# Patient Record
Sex: Female | Born: 1965
Health system: Southern US, Community
[De-identification: ages and names within clinical notes are randomized; demographics above are authoritative.]

## PROBLEM LIST (undated history)

## (undated) DIAGNOSIS — Z789 Other specified health status: Secondary | ICD-10-CM

## (undated) HISTORY — PX: KNEE ARTHROSCOPY: SUR90

## (undated) HISTORY — PX: APPENDECTOMY: SHX54

---

## 2005-11-25 ENCOUNTER — Encounter: Admission: RE | Admit: 2005-11-25 | Discharge: 2005-11-25 | Payer: Self-pay | Admitting: Obstetrics and Gynecology

## 2013-05-01 ENCOUNTER — Other Ambulatory Visit: Payer: Self-pay | Admitting: Obstetrics and Gynecology

## 2013-05-01 DIAGNOSIS — R928 Other abnormal and inconclusive findings on diagnostic imaging of breast: Secondary | ICD-10-CM

## 2013-05-04 ENCOUNTER — Ambulatory Visit
Admission: RE | Admit: 2013-05-04 | Discharge: 2013-05-04 | Disposition: A | Discharge status: Home / Self Care | Payer: 59 | Source: Ambulatory Visit | Attending: Obstetrics and Gynecology | Admitting: Obstetrics and Gynecology

## 2013-05-04 DIAGNOSIS — R928 Other abnormal and inconclusive findings on diagnostic imaging of breast: Secondary | ICD-10-CM

## 2014-06-05 ENCOUNTER — Encounter (HOSPITAL_COMMUNITY): Payer: Self-pay | Admitting: *Deleted

## 2014-06-05 ENCOUNTER — Encounter (HOSPITAL_COMMUNITY): Payer: Self-pay | Admitting: Pharmacist

## 2014-06-13 ENCOUNTER — Other Ambulatory Visit: Payer: Self-pay | Admitting: Obstetrics and Gynecology

## 2014-06-15 ENCOUNTER — Encounter (HOSPITAL_COMMUNITY): Payer: 59 | Admitting: Anesthesiology

## 2014-06-15 ENCOUNTER — Ambulatory Visit (HOSPITAL_COMMUNITY): Payer: 59 | Admitting: Anesthesiology

## 2014-06-15 ENCOUNTER — Ambulatory Visit (HOSPITAL_COMMUNITY)
Admission: RE | Admit: 2014-06-15 | Discharge: 2014-06-15 | Disposition: A | Discharge status: Home / Self Care | Payer: 59 | Source: Ambulatory Visit | Attending: Obstetrics and Gynecology | Admitting: Obstetrics and Gynecology

## 2014-06-15 ENCOUNTER — Encounter (HOSPITAL_COMMUNITY): Payer: Self-pay | Admitting: Anesthesiology

## 2014-06-15 ENCOUNTER — Encounter (HOSPITAL_COMMUNITY)
Admission: RE | Disposition: A | Discharge status: Home / Self Care | Payer: Self-pay | Source: Ambulatory Visit | Attending: Obstetrics and Gynecology

## 2014-06-15 DIAGNOSIS — F329 Major depressive disorder, single episode, unspecified: Secondary | ICD-10-CM | POA: Diagnosis not present

## 2014-06-15 DIAGNOSIS — N920 Excessive and frequent menstruation with regular cycle: Secondary | ICD-10-CM | POA: Diagnosis not present

## 2014-06-15 DIAGNOSIS — F419 Anxiety disorder, unspecified: Secondary | ICD-10-CM | POA: Insufficient documentation

## 2014-06-15 DIAGNOSIS — E780 Pure hypercholesterolemia: Secondary | ICD-10-CM | POA: Insufficient documentation

## 2014-06-15 DIAGNOSIS — N840 Polyp of corpus uteri: Secondary | ICD-10-CM | POA: Insufficient documentation

## 2014-06-15 HISTORY — DX: Other specified health status: Z78.9

## 2014-06-15 HISTORY — PX: POLYPECTOMY: SHX5525

## 2014-06-15 HISTORY — PX: DILITATION & CURRETTAGE/HYSTROSCOPY WITH NOVASURE ABLATION: SHX5568

## 2014-06-15 LAB — PREGNANCY, URINE: PREG TEST UR: NEGATIVE

## 2014-06-15 SURGERY — DILATATION & CURETTAGE/HYSTEROSCOPY WITH NOVASURE ABLATION
Anesthesia: General | Site: Vagina

## 2014-06-15 MED ORDER — DEXAMETHASONE SODIUM PHOSPHATE 10 MG/ML IJ SOLN
INTRAMUSCULAR | Status: DC | PRN
  Administered 2014-06-15: 4 mg via INTRAVENOUS

## 2014-06-15 MED ORDER — MIDAZOLAM HCL 2 MG/2ML IJ SOLN
INTRAMUSCULAR | Status: AC
  Filled 2014-06-15: qty 2

## 2014-06-15 MED ORDER — MIDAZOLAM HCL 2 MG/2ML IJ SOLN
INTRAMUSCULAR | Status: DC | PRN
  Administered 2014-06-15: 2 mg via INTRAVENOUS

## 2014-06-15 MED ORDER — KETOROLAC TROMETHAMINE 30 MG/ML IJ SOLN: 15.0000 mg | Freq: Once | INTRAMUSCULAR | Status: DC | PRN

## 2014-06-15 MED ORDER — MEPERIDINE HCL 25 MG/ML IJ SOLN: 6.2500 mg | INTRAMUSCULAR | Status: DC | PRN

## 2014-06-15 MED ORDER — KETOROLAC TROMETHAMINE 30 MG/ML IJ SOLN
INTRAMUSCULAR | Status: AC
  Filled 2014-06-15: qty 1

## 2014-06-15 MED ORDER — ONDANSETRON HCL 4 MG/2ML IJ SOLN
INTRAMUSCULAR | Status: AC
  Filled 2014-06-15: qty 2

## 2014-06-15 MED ORDER — PROPOFOL 10 MG/ML IV BOLUS
INTRAVENOUS | Status: DC | PRN
  Administered 2014-06-15: 150 mg via INTRAVENOUS

## 2014-06-15 MED ORDER — ONDANSETRON HCL 4 MG/2ML IJ SOLN
INTRAMUSCULAR | Status: DC | PRN
  Administered 2014-06-15: 4 mg via INTRAVENOUS

## 2014-06-15 MED ORDER — FENTANYL CITRATE 0.05 MG/ML IJ SOLN
INTRAMUSCULAR | Status: AC
  Filled 2014-06-15: qty 2

## 2014-06-15 MED ORDER — ONDANSETRON HCL 4 MG/2ML IJ SOLN: 4.0000 mg | Freq: Once | INTRAMUSCULAR | Status: DC | PRN

## 2014-06-15 MED ORDER — LIDOCAINE HCL 1 % IJ SOLN
INTRAMUSCULAR | Status: DC | PRN
  Administered 2014-06-15: 10 mL

## 2014-06-15 MED ORDER — LACTATED RINGERS IV SOLN
INTRAVENOUS | Status: DC
Start: 2014-06-15 — End: 2014-06-15
  Administered 2014-06-15 (×2): via INTRAVENOUS

## 2014-06-15 MED ORDER — SCOPOLAMINE 1 MG/3DAYS TD PT72
MEDICATED_PATCH | TRANSDERMAL | Status: AC
  Filled 2014-06-15: qty 1

## 2014-06-15 MED ORDER — KETOROLAC TROMETHAMINE 30 MG/ML IJ SOLN
INTRAMUSCULAR | Status: DC | PRN
  Administered 2014-06-15: 30 mg via INTRAVENOUS

## 2014-06-15 MED ORDER — SCOPOLAMINE 1 MG/3DAYS TD PT72
1.0000 | MEDICATED_PATCH | Freq: Once | TRANSDERMAL | Status: DC
  Administered 2014-06-15: 1.5 mg via TRANSDERMAL

## 2014-06-15 MED ORDER — OXYCODONE-ACETAMINOPHEN 5-325 MG PO TABS: 1.0000 | ORAL_TABLET | ORAL | Status: DC | PRN

## 2014-06-15 MED ORDER — LIDOCAINE HCL (CARDIAC) 20 MG/ML IV SOLN
INTRAVENOUS | Status: AC
  Filled 2014-06-15: qty 5

## 2014-06-15 MED ORDER — FENTANYL CITRATE 0.05 MG/ML IJ SOLN: 25.0000 ug | INTRAMUSCULAR | Status: DC | PRN

## 2014-06-15 MED ORDER — SODIUM CHLORIDE 0.9 % IR SOLN
Status: DC | PRN
  Administered 2014-06-15: 3000 mL

## 2014-06-15 MED ORDER — OXYCODONE-ACETAMINOPHEN 5-325 MG PO TABS: 1.0000 | ORAL_TABLET | ORAL | Status: AC | PRN

## 2014-06-15 MED ORDER — FENTANYL CITRATE 0.05 MG/ML IJ SOLN
INTRAMUSCULAR | Status: DC | PRN
  Administered 2014-06-15 (×2): 50 ug via INTRAVENOUS

## 2014-06-15 MED ORDER — PROPOFOL 10 MG/ML IV EMUL
INTRAVENOUS | Status: AC
  Filled 2014-06-15: qty 20

## 2014-06-15 MED ORDER — LIDOCAINE HCL 1 % IJ SOLN
INTRAMUSCULAR | Status: AC
  Filled 2014-06-15: qty 20

## 2014-06-15 MED ORDER — DEXAMETHASONE SODIUM PHOSPHATE 4 MG/ML IJ SOLN
INTRAMUSCULAR | Status: AC
  Filled 2014-06-15: qty 1

## 2014-06-15 MED ORDER — LIDOCAINE HCL (CARDIAC) 20 MG/ML IV SOLN
INTRAVENOUS | Status: DC | PRN
Start: 2014-06-15 — End: 2014-06-15
  Administered 2014-06-15: 50 mg via INTRAVENOUS

## 2014-06-15 MED ORDER — LACTATED RINGERS IV SOLN
INTRAVENOUS | Status: DC
Start: 2014-06-15 — End: 2014-06-15

## 2014-06-15 SURGICAL SUPPLY — 16 items
ABLATOR ENDOMETRIAL BIPOLAR (ABLATOR) IMPLANT
CANISTER SUCT 3000ML (MISCELLANEOUS) ×3 IMPLANT
CATH ROBINSON RED A/P 16FR (CATHETERS) ×3 IMPLANT
CLOTH BEACON ORANGE TIMEOUT ST (SAFETY) ×3 IMPLANT
CONTAINER PREFILL 10% NBF 60ML (FORM) ×6 IMPLANT
DRAPE HYSTEROSCOPY (DRAPE) ×3 IMPLANT
GLOVE BIOGEL PI IND STRL 6.5 (GLOVE) ×2 IMPLANT
GLOVE BIOGEL PI INDICATOR 6.5 (GLOVE) ×4
GLOVE ECLIPSE 6.5 STRL STRAW (GLOVE) ×3 IMPLANT
GOWN STRL REUS W/TWL LRG LVL3 (GOWN DISPOSABLE) ×6 IMPLANT
PACK VAGINAL MINOR WOMEN LF (CUSTOM PROCEDURE TRAY) ×3 IMPLANT
PAD OB MATERNITY 4.3X12.25 (PERSONAL CARE ITEMS) ×3 IMPLANT
SET TUBING HYSTEROSCOPY 2 NDL (TUBING) ×2 IMPLANT
TOWEL OR 17X24 6PK STRL BLUE (TOWEL DISPOSABLE) ×6 IMPLANT
TUBE HYSTEROSCOPY W Y-CONNECT (TUBING) ×2 IMPLANT
WATER STERILE IRR 1000ML POUR (IV SOLUTION) ×3 IMPLANT

## 2014-06-15 NOTE — Brief Op Note (Signed)
06/15/2014  1:21 PM  PATIENT:  Tamara Glenn  48 y.o. female  PRE-OPERATIVE DIAGNOSIS:  MENORRHAGIA  POST-OPERATIVE DIAGNOSIS:  endometrial polyp  PROCEDURE:  Procedure(s): DILATATION & CURETTAGE/HYSTEROSCOPY  (N/A) POLYPECTOMY (N/A)  SURGEON:  Surgeon(s) and Role:    Philip Aspen* Oceanna Arruda, DO - Primary  ANESTHESIA:   IV sedation  EBL:  Total I/O In: 1200 [I.V.:1200] Out: 60 [Urine:50; Blood:10]  BLOOD ADMINISTERED:none  LOCAL MEDICATIONS USED:  LIDOCAINE   SPECIMEN:  Source of Specimen:  EMC  and polypoid tissue  DISPOSITION OF SPECIMEN:  PATHOLOGY  COUNTS:  yes  PLAN OF CARE: Discharge to home after PACU  PATIENT DISPOSITION:  PACU - hemodynamically stable.   Delay start of Pharmacological VTE agent (>24hrs) due to surgical blood loss or risk of bleeding: not applicable

## 2014-06-15 NOTE — H&P (Signed)
48 y.o. complains of worsening abnormal uterine bleeding frm 2014-2015.  She has a known history of fibroids - 4 small once ranging 1.3-3.7cm.  Recent US showed 2.3cm echogenic focus in endometrium- possible polyp.  Past Medical History  Diagnosis Date  . Medical history non-contributory   MPDD, anxiety, depression, high cholesterol Past Surgical History  Procedure Laterality Date  . Appendectomy    . Knee arthroscopy      History   Social History  . Marital Status: Married    Spouse Name: N/A    Number of Children: N/A  . Years of Education: N/A   Occupational History  . Not on file.   Social History Main Topics  . Smoking status: Never Smoker   . Smokeless tobacco: Not on file  . Alcohol Use: Yes     Comment: occ.  . Drug Use: No  . Sexual Activity: Not on file   Other Topics Concern  . Not on file   Social History Narrative  . No narrative on file    No current facility-administered medications on file prior to encounter.   No current outpatient prescriptions on file prior to encounter.    No Known Allergies  @VITALS2 @  Lungs: clear to ascultation Cor:  RRR Abdomen:  soft, nontender, nondistended. Ex:  no cords, erythema Pelvic:  Def to OR  A:  Perimenopausal abnormal uterine bleeding with US findings c/w possible polyp   P:  D&C hysteroscopy, polypectomy.   All risks, benefits and alternatives d/w patient and she desires to proceed. UPT and other routine pre-op care.    Philip AspenALLAHAN, Mattalyn Anderegg

## 2014-06-15 NOTE — Discharge Instructions (Signed)
DISCHARGE INSTRUCTIONS: D&C  The following instructions have been prepared to help you care for yourself upon your return home.  MAY TAKE IBUPROFEN (MOTRIN, ADVIL) OR ALEVE AFTER 1:00 PM FOR CRAMPS!!   Personal hygiene:  Use sanitary pads for vaginal drainage, not tampons.  Shower the day after your procedure.  NO tub baths, pools or Jacuzzis for 2-3 weeks.  Wipe front to back after using the bathroom.  Activity and limitations:  Do NOT drive or operate any equipment for 24 hours. The effects of anesthesia are still present and drowsiness may result.  Do NOT rest in bed all day.  Walking is encouraged.  Walk up and down stairs slowly.  You may resume your normal activity in one to two days or as indicated by your physician.  Sexual activity: NO intercourse for at least 2 weeks after the procedure, or as indicated by your physician.  Diet: Eat a light meal as desired this evening. You may resume your usual diet tomorrow.  Return to work: You may resume your work activities in one to two days or as indicated by your doctor.  What to expect after your surgery: Expect to have vaginal bleeding/discharge for 2-3 days and spotting for up to 10 days. It is not unusual to have soreness for up to 1-2 weeks. You may have a slight burning sensation when you urinate for the first day. Mild cramps may continue for a couple of days. You may have a regular period in 2-6 weeks.  Call your doctor for any of the following:  Excessive vaginal bleeding, saturating and changing one pad every hour.  Inability to urinate 6 hours after discharge from hospital.  Pain not relieved by pain medication.  Fever of 100.4 F or greater.  Unusual vaginal discharge or odor.   Call for an appointment:    Patients signature: ______________________  Nurses signature ________________________  Support person's signature_______________________

## 2014-06-15 NOTE — Transfer of Care (Signed)
Immediate Anesthesia Transfer of Care Note  Patient: Tamara Glenn  Procedure(s) Performed: Procedure(s): DILATATION & CURETTAGE/HYSTEROSCOPY  (N/A) POLYPECTOMY (N/A)  Patient Location: PACU  Anesthesia Type:General  Level of Consciousness: awake, alert  and oriented  Airway & Oxygen Therapy: Patient Spontanous Breathing and Patient connected to nasal cannula oxygen  Post-op Assessment: Report given to PACU RN and Post -op Vital signs reviewed and stable  Post vital signs: Reviewed and stable  Complications: No apparent anesthesia complications

## 2014-06-15 NOTE — Anesthesia Postprocedure Evaluation (Signed)
  Anesthesia Post-op Note  Patient: Tamara Glenn  Procedure(s) Performed: Procedure(s): DILATATION & CURETTAGE/HYSTEROSCOPY  (N/A) POLYPECTOMY (N/A)  Patient Location: PACU  Anesthesia Type:General  Level of Consciousness: awake, alert  and oriented  Airway and Oxygen Therapy: Patient Spontanous Breathing  Post-op Pain: none  Post-op Assessment: Post-op Vital signs reviewed, Patient's Cardiovascular Status Stable, Respiratory Function Stable, Patent Airway, No signs of Nausea or vomiting and Pain level controlled  Post-op Vital Signs: Reviewed and stable  Last Vitals:  Filed Vitals:   06/15/14 1400  BP: 137/79  Pulse: 77  Temp: 36.6 C  Resp: 13    Complications: No apparent anesthesia complications

## 2014-06-15 NOTE — Anesthesia Preprocedure Evaluation (Signed)
Anesthesia Evaluation  Patient identified by MRN, date of birth, ID band Patient awake    Reviewed: Allergy & Precautions, H&P , NPO status , Patient's Chart, lab work & pertinent test results  Airway Mallampati: I TM Distance: >3 FB Neck ROM: full    Dental no notable dental hx. (+) Teeth Intact   Pulmonary neg pulmonary ROS,    Pulmonary exam normal       Cardiovascular negative cardio ROS      Neuro/Psych negative neurological ROS     GI/Hepatic negative GI ROS, Neg liver ROS,   Endo/Other  negative endocrine ROS  Renal/GU negative Renal ROS     Musculoskeletal   Abdominal Normal abdominal exam  (+)   Peds  Hematology negative hematology ROS (+)   Anesthesia Other Findings   Reproductive/Obstetrics negative OB ROS                           Anesthesia Physical Anesthesia Plan  ASA: II  Anesthesia Plan: General   Post-op Pain Management:    Induction: Intravenous  Airway Management Planned: LMA  Additional Equipment:   Intra-op Plan:   Post-operative Plan:   Informed Consent: I have reviewed the patients History and Physical, chart, labs and discussed the procedure including the risks, benefits and alternatives for the proposed anesthesia with the patient or authorized representative who has indicated his/her understanding and acceptance.     Plan Discussed with: CRNA and Surgeon  Anesthesia Plan Comments:         Anesthesia Quick Evaluation  

## 2014-06-16 NOTE — Op Note (Signed)
[name], [name]             ACCOUNT NO.:  [id number]  MEDICAL RECORD NO.:  [id number]  LOCATION:  [facility]                          FACILITY:  [facility]  PHYSICIAN:  Philip AspenSidney Rucker Pridgeon, DO    DATE OF BIRTH:  October 17, 1965  DATE OF PROCEDURE:  06/15/2014 DATE OF DISCHARGE:  06/15/2014                              OPERATIVE REPORT   PREOPERATIVE DIAGNOSES:  Menorrhagia, ultrasound findings of thickened endometrial stripe consistent with polyp.  POSTOPERATIVE DIAGNOSIS:  Menorrhagia, ultrasound findings of thickened endometrial stripe consistent with polyp, endometrial polyp visualized.  PROCEDURE:  Dilation and curettage, hysteroscopy with polypectomy.  SURGEON:  Philip AspenSidney Jentry Warnell, DO.  ANESTHESIA:  IV sedation.  IV FLUIDS:  1200 mL.  URINE OUTPUT:  50 mL.  ESTIMATED BLOOD LOSS:  10 mL.  LOCAL MEDICATIONS:  10 mL of 1% lidocaine paracervical block.  SPECIMENS:  Endometrial curettage specimen with polypoid tissue. Disposition of specimens to pathology.  FINDINGS:  Uterus sounded to approximately 7 cm.  Anteverted uterus with a large polypoid mass visualized with hysteroscopy.  COMPLICATIONS:  None.  CONDITION:  Stable to PACU.  DESCRIPTION OF PROCEDURE:  The patient was taken to the operating room where anesthesia was administered and found to be adequate.  She was prepped and draped in normal sterile fashion in dorsal lithotomy position.  Bladder was emptied with straight catheter.  Speculum was placed in the vagina.  Cervix was visualized and grasped with a single- tooth tenaculum at the anterior lip.  The uterus was sounded and found to be approximately 7 cm.  The cervix was dilated serially to approximately 25 Pratt and the hysteroscope was entered through the cervix with findings noted above.  The hysteroscope was removed and polyp forceps were introduced with removal of polypoid tissue after several passes.  Curettage was performed in all 4 quadrants and hysteroscope was  then reintroduced.  Polyp was still visualized and a second pass with polyp forceps was performed with additional finding polypoid tissue removed.  The hysteroscope was reinserted and endometrial cavity was found to be empty of further polyps and no other abnormalities noted.  All instruments were removed from the uterus and vagina.  Single-tooth tenaculum sites were found to be hemostatic.  The patient tolerated the procedure well.  Sponge, lap, and needle counts were correct x2.  The patient was taken to recovery in stable condition.          ______________________________ Philip AspenSidney Alfa Leibensperger, DO     [place]/MEDQ  D:  06/16/2014  T:  06/16/2014  Job:  [id number]

## 2014-06-18 ENCOUNTER — Encounter (HOSPITAL_COMMUNITY): Payer: Self-pay | Admitting: Obstetrics and Gynecology

## 2014-10-19 ENCOUNTER — Other Ambulatory Visit: Payer: Self-pay | Admitting: Obstetrics and Gynecology

## 2017-01-20 ENCOUNTER — Other Ambulatory Visit: Payer: Self-pay | Admitting: Obstetrics and Gynecology

## 2017-01-22 ENCOUNTER — Other Ambulatory Visit: Payer: Self-pay | Admitting: Obstetrics and Gynecology

## 2017-01-22 DIAGNOSIS — R928 Other abnormal and inconclusive findings on diagnostic imaging of breast: Secondary | ICD-10-CM

## 2017-01-22 LAB — CYTOLOGY - PAP

## 2017-01-27 ENCOUNTER — Ambulatory Visit
Admission: RE | Admit: 2017-01-27 | Discharge: 2017-01-27 | Disposition: A | Discharge status: Home / Self Care | Payer: Self-pay | Source: Ambulatory Visit | Attending: Obstetrics and Gynecology | Admitting: Obstetrics and Gynecology

## 2017-01-27 DIAGNOSIS — R928 Other abnormal and inconclusive findings on diagnostic imaging of breast: Secondary | ICD-10-CM

## 2019-02-22 ENCOUNTER — Encounter (HOSPITAL_BASED_OUTPATIENT_CLINIC_OR_DEPARTMENT_OTHER): Payer: Self-pay | Admitting: *Deleted

## 2019-02-22 ENCOUNTER — Emergency Department (HOSPITAL_BASED_OUTPATIENT_CLINIC_OR_DEPARTMENT_OTHER)
Admission: EM | Admit: 2019-02-22 | Discharge: 2019-02-22 | Disposition: A | Discharge status: Home / Self Care | Payer: 59 | Attending: Emergency Medicine | Admitting: Emergency Medicine

## 2019-02-22 ENCOUNTER — Other Ambulatory Visit: Payer: Self-pay

## 2019-02-22 ENCOUNTER — Emergency Department (HOSPITAL_BASED_OUTPATIENT_CLINIC_OR_DEPARTMENT_OTHER): Payer: 59

## 2019-02-22 DIAGNOSIS — R55 Syncope and collapse: Secondary | ICD-10-CM | POA: Diagnosis not present

## 2019-02-22 DIAGNOSIS — R5383 Other fatigue: Secondary | ICD-10-CM

## 2019-02-22 LAB — BASIC METABOLIC PANEL
Anion gap: 11 (ref 5–15)
BUN: 21 mg/dL — ABNORMAL HIGH (ref 6–20)
CO2: 22 mmol/L (ref 22–32)
Calcium: 9.9 mg/dL (ref 8.9–10.3)
Chloride: 104 mmol/L (ref 98–111)
Creatinine, Ser: 0.98 mg/dL (ref 0.44–1.00)
GFR calc Af Amer: >60 mL/min (ref >60)
GFR calc non Af Amer: >60 mL/min (ref >60)
Glucose, Bld: 117 mg/dL — ABNORMAL HIGH (ref 70–99)
Potassium: 4 mmol/L (ref 3.5–5.1)
Sodium: 137 mmol/L (ref 135–145)

## 2019-02-22 LAB — CBC WITH DIFFERENTIAL/PLATELET
Abs Immature Granulocytes: 0.05 10*3/uL (ref 0.00–0.07)
Basophils Absolute: 0 10*3/uL (ref 0.0–0.1)
Basophils Relative: 0 %
Eosinophils Absolute: 0.1 10*3/uL (ref 0.0–0.5)
Eosinophils Relative: 1 %
HCT: 44.9 % (ref 36.0–46.0)
Hemoglobin: 15.4 g/dL — ABNORMAL HIGH (ref 12.0–15.0)
Immature Granulocytes: 1 %
Lymphocytes Relative: 10 %
Lymphs Abs: 1.1 10*3/uL (ref 0.7–4.0)
MCH: 30.7 pg (ref 26.0–34.0)
MCHC: 34.3 g/dL (ref 30.0–36.0)
MCV: 89.4 fL (ref 80.0–100.0)
Monocytes Absolute: 0.4 10*3/uL (ref 0.1–1.0)
Monocytes Relative: 4 %
Neutro Abs: 9 10*3/uL — ABNORMAL HIGH (ref 1.7–7.7)
Neutrophils Relative %: 84 %
Platelets: 321 10*3/uL (ref 150–400)
RBC: 5.02 MIL/uL (ref 3.87–5.11)
RDW: 12.8 % (ref 11.5–15.5)
WBC: 10.6 10*3/uL — ABNORMAL HIGH (ref 4.0–10.5)
nRBC: 0 % (ref 0.0–0.2)

## 2019-02-22 LAB — TROPONIN I (HIGH SENSITIVITY): Troponin I (High Sensitivity): 3 ng/L (ref <18)

## 2019-02-22 MED ORDER — SODIUM CHLORIDE 0.9 % IV BOLUS
1000.0000 mL | Freq: Once | INTRAVENOUS | Status: AC
  Administered 2019-02-22: 1000 mL via INTRAVENOUS

## 2019-02-22 NOTE — ED Notes (Signed)
Pt lying flat for orthostatic VS 

## 2019-02-22 NOTE — ED Notes (Signed)
Patient transported to X-ray 

## 2019-02-22 NOTE — ED Triage Notes (Signed)
Pt c/o generalized fatigue and weakness  x 2 days 

## 2019-02-22 NOTE — ED Notes (Signed)
Pt on monitor 

## 2019-02-22 NOTE — ED Notes (Signed)
ED Provider at bedside. 

## 2019-02-22 NOTE — Discharge Instructions (Addendum)
You were seen in the emergency department for feeling fatigued and lightheaded and feeling like he might pass out.  You had blood work EKG and a CAT scan of your head that did not show any serious findings.  You will need to follow-up with your doctor for further evaluation.  Please keep well-hydrated and return to the emergency department if any worsening symptoms.

## 2019-02-22 NOTE — ED Provider Notes (Signed)
[facility] EMERGENCY DEPARTMENT Provider Note   CSN: [id number] Arrival date & time: 02/22/19  1847     History   Chief Complaint Chief Complaint  Patient presents with  . Fatigue    HPI Tamara Glenn is a 53 y.o. female.  She is presenting with complaints of feeling weak after mowing the yard today.  She said she went to the bathroom feeling very fatigued and then had diarrhea and then felt like she was going to pass out.  She says both her hands were cramping and she was having difficulty speaking.  She laid on the floor and her symptoms resolved after a few minutes.  She currently feels mostly back to normal, may be feeling just a little bit short of breath.  She denies any chest pain no headache no blurry vision or double vision no numbness or focal weakness.  No cough no fever.  No change in her medications.  No prior history of any cardiac or stroke disease.     The history is provided by the patient.  Weakness Severity:  Severe Onset quality:  Sudden Progression:  Resolved Chronicity:  New Context: dehydration (maybe)   Context: not alcohol use, not change in medication, not decreased sleep, not drug use and not recent infection   Relieved by:  Rest Worsened by:  Nothing Ineffective treatments:  None tried Associated symptoms: diarrhea, difficulty walking, near-syncope and shortness of breath   Associated symptoms: no abdominal pain, no chest pain, no cough, no dysuria, no falls, no fever, no frequency, no headaches, no loss of consciousness, no melena and no vomiting     Past Medical History:  Diagnosis Date  . Medical history non-contributory     There are no active problems to display for this patient.   Past Surgical History:  Procedure Laterality Date  . APPENDECTOMY    . DILITATION & CURRETTAGE/HYSTROSCOPY WITH NOVASURE ABLATION N/A 06/15/2014   Procedure: DILATATION & CURETTAGE/HYSTEROSCOPY ;  Surgeon: Philip AspenSidney Callahan, DO;  Location: [facility];   Service: Gynecology;  Laterality: N/A;  . KNEE ARTHROSCOPY    . POLYPECTOMY N/A 06/15/2014   Procedure: POLYPECTOMY;  Surgeon: Philip AspenSidney Callahan, DO;  Location: [facility];  Service: Gynecology;  Laterality: N/A;     OB History   No obstetric history on file.      Home Medications    Prior to Admission medications   Medication Sig Start Date End Date Taking? Authorizing Provider  fexofenadine (ALLEGRA) 180 MG tablet Take 180 mg by mouth daily.    [provider]  Multiple Vitamin (MULTIVITAMIN WITH MINERALS) TABS tablet Take 1 tablet by mouth daily.    [provider]  naproxen (NAPROSYN) 250 MG tablet Take 250 mg by mouth 2 (two) times daily as needed.    [provider]  oxyCODONE-acetaminophen (PERCOCET/ROXICET) 5-325 MG per tablet Take 1-2 tablets by mouth every 4 (four) hours as needed for moderate pain. 06/15/14   Philip Aspenallahan, Sidney, DO  venlafaxine XR (EFFEXOR-XR) 75 MG 24 hr capsule Take 75 mg by mouth daily with breakfast.    [provider]    Family History No family history on file.  Social History Social History   Tobacco Use  . Smoking status: Never Smoker  . Smokeless tobacco: Never Used  Substance Use Topics  . Alcohol use: Yes    Comment: occ.  . Drug use: No     Allergies   Patient has no known allergies.   Review of Systems  Review of Systems  Constitutional: Positive for fatigue. Negative for fever.  HENT: Negative for sore throat.   Eyes: Negative for visual disturbance.  Respiratory: Positive for shortness of breath. Negative for cough.   Cardiovascular: Positive for near-syncope. Negative for chest pain.  Gastrointestinal: Positive for diarrhea. Negative for abdominal pain, melena and vomiting.  Genitourinary: Negative for dysuria and frequency.  Musculoskeletal: Negative for falls and neck pain.  Skin: Negative for rash.  Neurological: Positive for speech difficulty, weakness and light-headedness. Negative for  loss of consciousness and headaches.     Physical Exam Updated Vital Signs BP (!) 150/98   Pulse 89   Temp 97.9 F (36.6 C)   Resp 18   SpO2 100%   Physical Exam Vitals signs and nursing note reviewed.  Constitutional:      General: She is not in acute distress.    Appearance: She is well-developed.  HENT:     Head: Normocephalic and atraumatic.  Eyes:     Conjunctiva/sclera: Conjunctivae normal.  Neck:     Musculoskeletal: Neck supple.  Cardiovascular:     Rate and Rhythm: Normal rate and regular rhythm.     Heart sounds: No murmur.  Pulmonary:     Effort: Pulmonary effort is normal. No respiratory distress.     Breath sounds: Normal breath sounds.  Abdominal:     Palpations: Abdomen is soft.     Tenderness: There is no abdominal tenderness.  Musculoskeletal: Normal range of motion.     Right lower leg: No edema.     Left lower leg: No edema.  Skin:    General: Skin is warm and dry.     Capillary Refill: Capillary refill takes less than 2 seconds.  Neurological:     General: No focal deficit present.     Mental Status: She is alert and oriented to person, place, and time.     Cranial Nerves: No cranial nerve deficit.     Sensory: No sensory deficit.     Motor: No weakness.     Gait: Gait normal.      ED Treatments / Results  Labs (all labs ordered are listed, but only abnormal results are displayed) Labs Reviewed  BASIC METABOLIC PANEL - Abnormal; Notable for the following components:      Result Value   Glucose, Bld 117 (*)    BUN 21 (*)    All other components within normal limits  CBC WITH DIFFERENTIAL/PLATELET - Abnormal; Notable for the following components:   WBC 10.6 (*)    Hemoglobin 15.4 (*)    Neutro Abs 9.0 (*)    All other components within normal limits  TROPONIN I (HIGH SENSITIVITY)  TROPONIN I (HIGH SENSITIVITY)    EKG EKG Interpretation  Date/Time:  Wednesday February 22 2019 19:21:22 EDT Ventricular Rate:  78 PR Interval:    QRS  Duration: 91 QT Interval:  364 QTC Calculation: 415 R Axis:   81 Text Interpretation:  Sinus rhythm Probable left atrial enlargement Nonspecific T abnrm, anterolateral leads no prior to compare with Confirmed by Meridee ScoreButler,  [phone]([id number]) on 02/22/2019 7:23:14 PM   Radiology Ct Head Wo Contrast  Result Date: 02/22/2019 CLINICAL DATA:  53 year old female with history of generalized fatigue and weakness for the past 2 days. EXAM: CT HEAD WITHOUT CONTRAST TECHNIQUE: Contiguous axial images were obtained from the base of the skull through the vertex without intravenous contrast. COMPARISON:  No priors. FINDINGS: Brain: No evidence of acute infarction, hemorrhage, hydrocephalus, extra-axial collection  or mass lesion/mass effect. Vascular: No hyperdense vessel or unexpected calcification. Skull: Normal. Negative for fracture or focal lesion. Sinuses/Orbits: No acute finding. Other: None. IMPRESSION: 1. No acute intracranial abnormalities. The appearance of the brain is normal. Electronically Signed   By: Vinnie Langton M.D.   On: 02/22/2019 19:28    Procedures Procedures (including critical care time)  Medications Ordered in ED Medications  sodium chloride 0.9 % bolus 1,000 mL (has no administration in time range)     Initial Impression / Assessment and Plan / ED Course  I have reviewed the triage vital signs and the nursing notes.  Pertinent labs & imaging results that were available during my care of the patient were reviewed by me and considered in my medical decision making (see chart for details).  Clinical Course as of Feb 22 2044  Wed Feb 22, 2019  1905 Patient states she has been mowing the last for a couple of days and was out in the garden in the yard today but not doing much when she experienced fatigue and then ongoing in the house she began experiencing diarrhea on the toilet and then cramping in her hands and difficulty getting her words out.  This resolved with resting on the floor.   Differential includes vasovagal, dehydration, TIA, arrhythmia, metabolic derangement.   [MB]  2007 Patient's high-sensitivity troponin is 3.  It was a very atypical story anyway for cardiac cath doubt this is ischemia.  Head CT is also unremarkable.  Will review with patient.   [MB]  2036 Patient's orthostatics were negative.  She is feeling back to baseline.  She is comfortable with being discharged and following up with her PCP.  She understands to return if any worsening symptoms.   [MB]    Clinical Course User Index [MB] Hayden Rasmussen, MD        Final Clinical Impressions(s) / ED Diagnoses   Final diagnoses:  Fatigue, unspecified type  Near syncope    ED Discharge Orders    None       Hayden Rasmussen, MD 02/22/19 2045

## 2019-11-01 ENCOUNTER — Other Ambulatory Visit (HOSPITAL_BASED_OUTPATIENT_CLINIC_OR_DEPARTMENT_OTHER): Payer: Self-pay | Admitting: Physician Assistant

## 2019-11-01 DIAGNOSIS — Z823 Family history of stroke: Secondary | ICD-10-CM

## 2019-11-01 DIAGNOSIS — E7800 Pure hypercholesterolemia, unspecified: Secondary | ICD-10-CM

## 2019-11-02 ENCOUNTER — Ambulatory Visit (HOSPITAL_BASED_OUTPATIENT_CLINIC_OR_DEPARTMENT_OTHER)
Admission: RE | Admit: 2019-11-02 | Discharge: 2019-11-02 | Disposition: A | Discharge status: Home / Self Care | Payer: 59 | Source: Ambulatory Visit | Attending: Physician Assistant | Admitting: Physician Assistant

## 2019-11-02 ENCOUNTER — Other Ambulatory Visit: Payer: Self-pay

## 2019-11-02 DIAGNOSIS — E7800 Pure hypercholesterolemia, unspecified: Secondary | ICD-10-CM | POA: Insufficient documentation

## 2019-11-02 DIAGNOSIS — Z823 Family history of stroke: Secondary | ICD-10-CM | POA: Insufficient documentation

## 2020-05-24 ENCOUNTER — Other Ambulatory Visit: Payer: Self-pay | Admitting: Obstetrics and Gynecology

## 2020-05-24 DIAGNOSIS — R928 Other abnormal and inconclusive findings on diagnostic imaging of breast: Secondary | ICD-10-CM

## 2020-06-18 ENCOUNTER — Ambulatory Visit: Payer: 59

## 2020-06-18 ENCOUNTER — Ambulatory Visit
Admission: RE | Admit: 2020-06-18 | Discharge: 2020-06-18 | Disposition: A | Discharge status: Home / Self Care | Payer: 59 | Source: Ambulatory Visit | Attending: Obstetrics and Gynecology | Admitting: Obstetrics and Gynecology

## 2020-06-18 ENCOUNTER — Other Ambulatory Visit: Payer: Self-pay

## 2020-06-18 DIAGNOSIS — R928 Other abnormal and inconclusive findings on diagnostic imaging of breast: Secondary | ICD-10-CM

## 2021-03-21 ENCOUNTER — Other Ambulatory Visit: Payer: Self-pay

## 2021-07-08 ENCOUNTER — Other Ambulatory Visit: Payer: Self-pay | Admitting: Family Medicine

## 2021-07-08 ENCOUNTER — Other Ambulatory Visit (HOSPITAL_COMMUNITY): Payer: Self-pay | Admitting: Family Medicine

## 2021-07-08 DIAGNOSIS — R1319 Other dysphagia: Secondary | ICD-10-CM

## 2021-07-22 ENCOUNTER — Other Ambulatory Visit (HOSPITAL_COMMUNITY): Payer: 59

## 2021-08-26 ENCOUNTER — Inpatient Hospital Stay (HOSPITAL_COMMUNITY): Admission: RE | Admit: 2021-08-26 | Payer: 59 | Source: Ambulatory Visit

## 2021-09-15 ENCOUNTER — Other Ambulatory Visit: Payer: Self-pay

## 2021-09-15 ENCOUNTER — Ambulatory Visit (HOSPITAL_COMMUNITY)
Admission: RE | Admit: 2021-09-15 | Discharge: 2021-09-15 | Disposition: A | Discharge status: Home / Self Care | Payer: 59 | Source: Ambulatory Visit | Attending: Family Medicine | Admitting: Family Medicine

## 2021-09-15 DIAGNOSIS — R1319 Other dysphagia: Secondary | ICD-10-CM | POA: Diagnosis not present

## 2022-04-25 IMAGING — RF DG ESOPHAGUS
10 series · 14 of 24 positions shown · non-contrast
Comparison: None.

CLINICAL DATA: Dysphagia, intermittent

EXAM:
ESOPHOGRAM / BARIUM SWALLOW / BARIUM TABLET STUDY
TECHNIQUE: Combined double contrast and single contrast examination performed
using effervescent crystals, thick barium liquid, and thin barium
liquid. The patient was observed with fluoroscopy swallowing a 13 mm
barium sulphate tablet.
FLUOROSCOPY TIME:  Fluoroscopy Time:  1 minute 42 seconds
Radiation Exposure Index (if provided by the fluoroscopic device):
26.4 mGy
Number of Acquired Spot Images: Not reported

[Series 1: cp_standard · 2 of 132 frames shown (1 of 5)]
[frame 20/132]
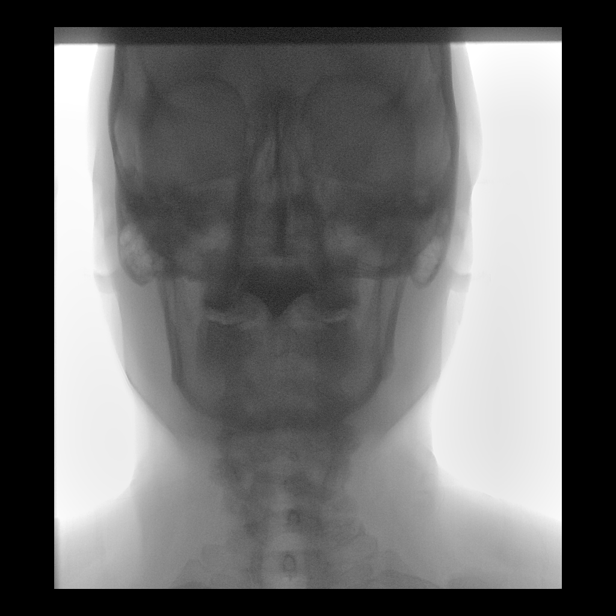
[frame 67/132]
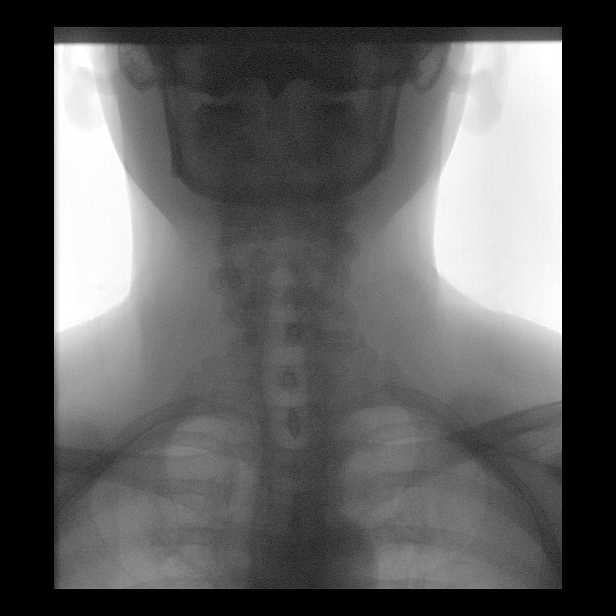

[Series 2: cp_standard · 2 of 88 frames shown (2 of 5)]
[frame 14/88]
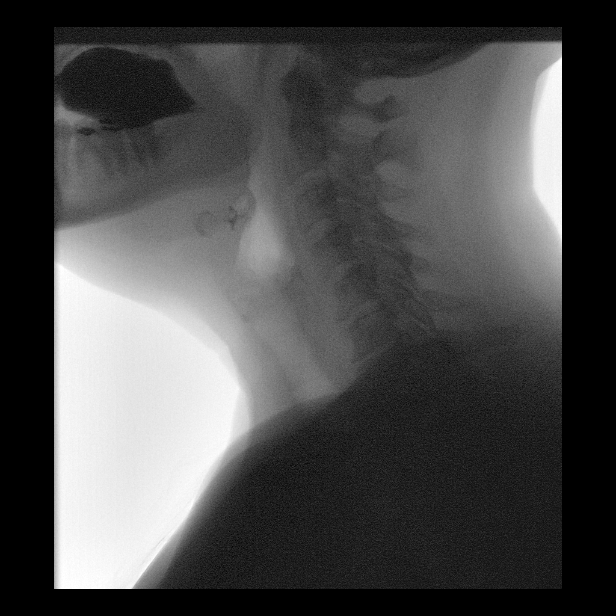
[frame 75/88]
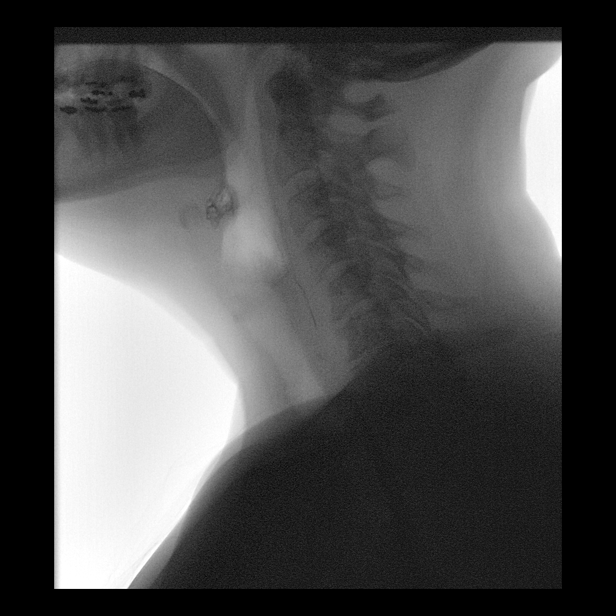

[Series 3: cp_standard · 2 of 113 frames shown (3 of 5)]
[frame 1/113]
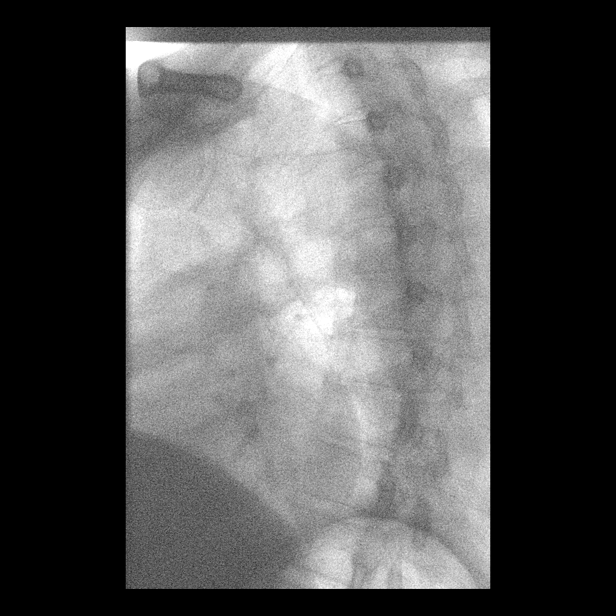
[frame 57/113]
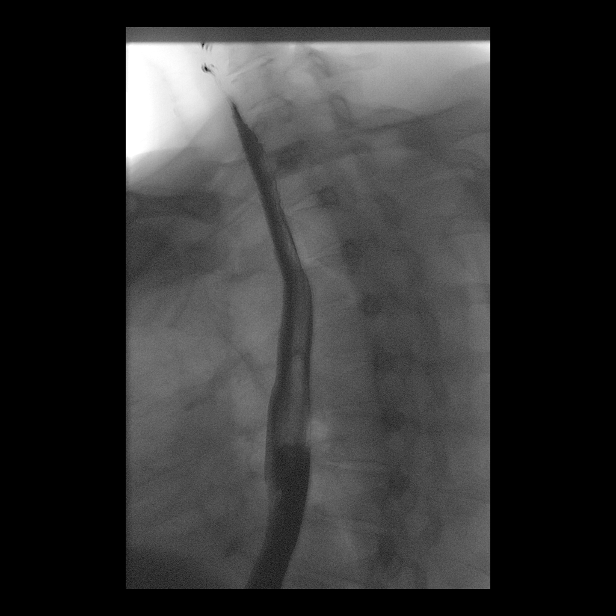

[Series 4: fluoro_barium 2fps_bw · 1 of 2 frames shown (1 of 5)]
[frame 1/2]
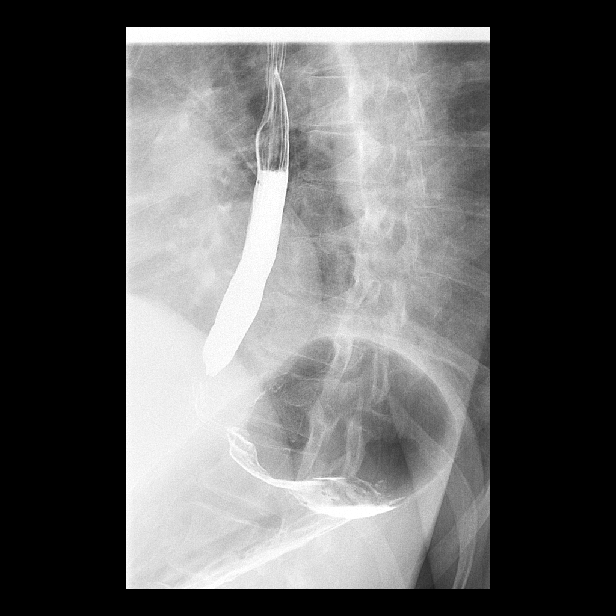

[Series 5: fluoro_barium 2fps_bw · 1 of 1 slices shown (2 of 5)]
[im 1/1]
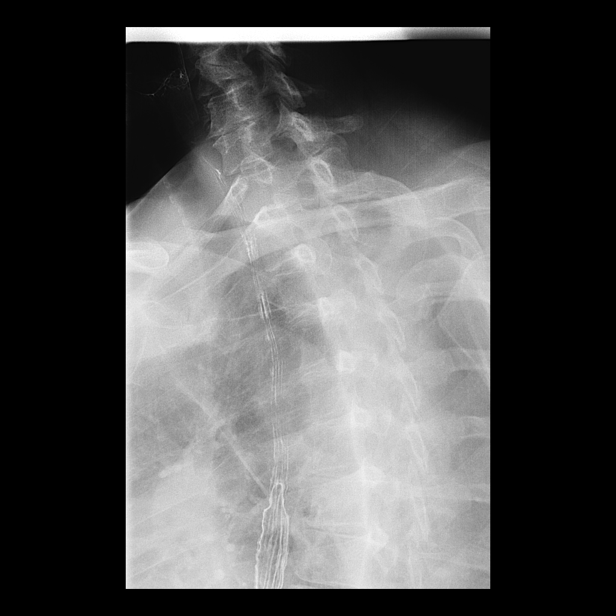

[Series 6: cp_standard · 2 of 84 frames shown (4 of 5)]
[frame 43/84]
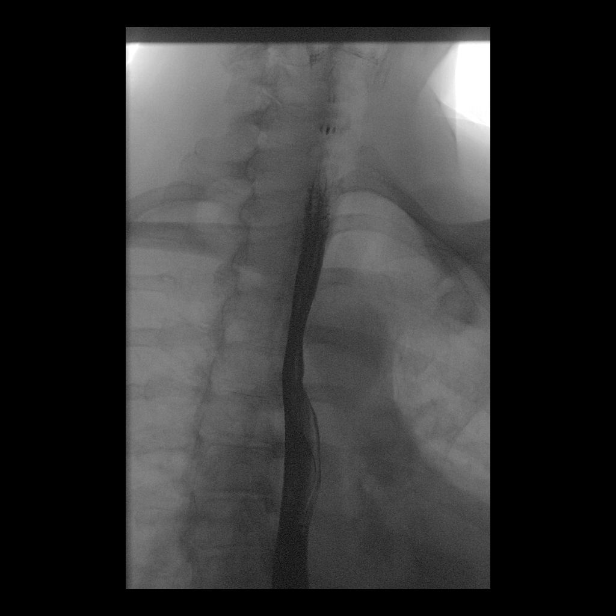
[frame 72/84]
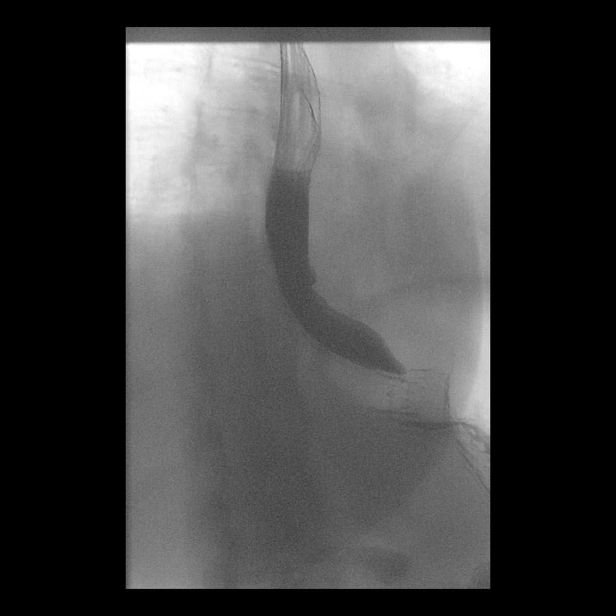

[Series 7: fluoro_barium 2fps_bw · 1 of 2 frames shown (3 of 5)]
[frame 2/2]
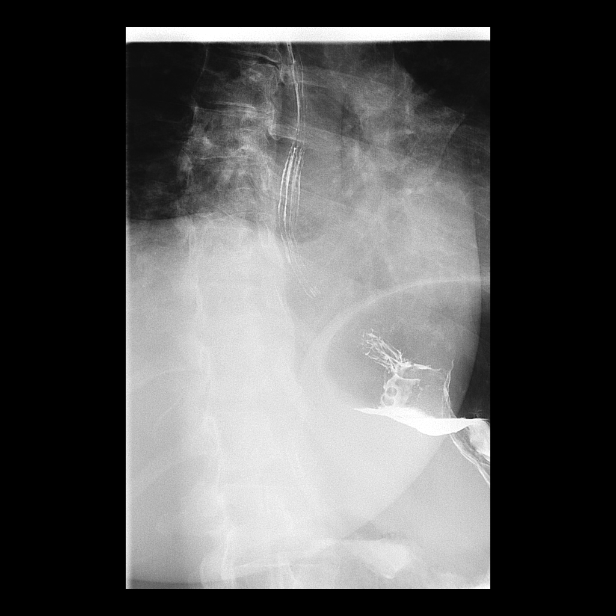

[Series 8: fluoro_barium 2fps_bw · 1 of 3 frames shown (4 of 5)]
[frame 1/3]
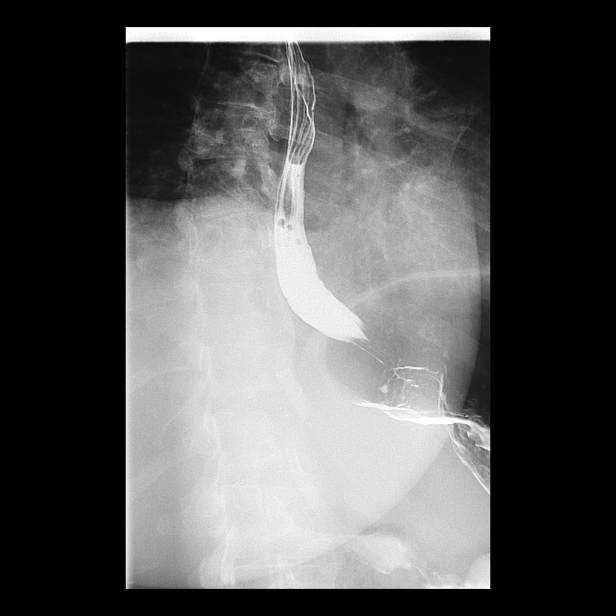

[Series 9: fluoro_barium 2fps_bw · 1 of 2 frames shown (5 of 5)]
[frame 1/2]
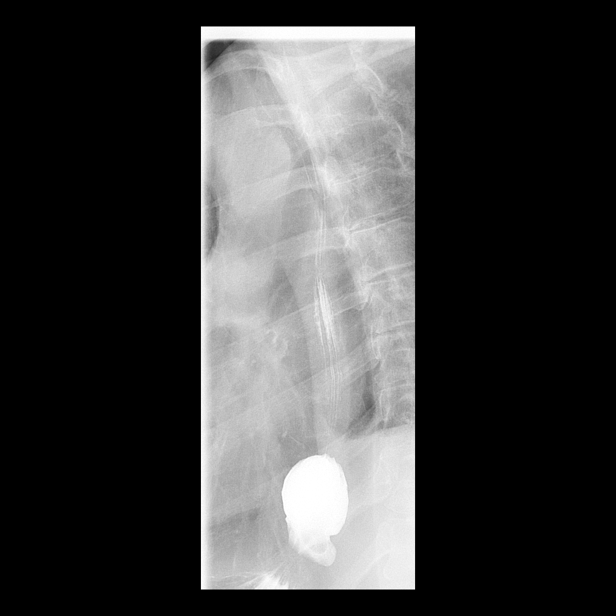

[Series 10: cp_standard · 1 of 1 slices shown (5 of 5)]
[im 1/1]
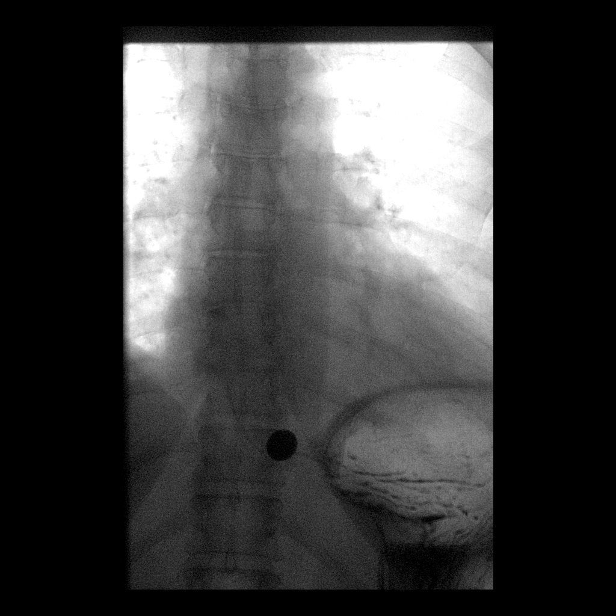

[14 of 24 positions shown; findings below may reference images not displayed]

FINDINGS: No swallowing dysfunction in the cervical esophagus. No stricture or
mass. Prominent cricopharyngeal bar extends approximately 40% into
the lumen of posterior esophagus

No mucosal irregularity within the thoracic esophagus or distal
esophagus. No stricture or mass. GE junction is normal. No
gastroesophageal reflux. No hiatal hernia. 13 mm barium tablet
passed GE junction easily.
IMPRESSION: 1. Prominent cricopharyngeal bar.
2. Normal esophagram otherwise
